# Patient Record
Sex: Male | Born: 1970 | Race: Black or African American | Hispanic: No | Marital: Single | Smoking: Current every day smoker
Health system: Southern US, Community
[De-identification: ages and names within clinical notes are randomized; demographics above are authoritative.]

## PROBLEM LIST (undated history)

## (undated) DIAGNOSIS — J439 Emphysema, unspecified: Secondary | ICD-10-CM

## (undated) DIAGNOSIS — F17200 Nicotine dependence, unspecified, uncomplicated: Secondary | ICD-10-CM

## (undated) HISTORY — DX: Emphysema, unspecified: J43.9

## (undated) HISTORY — DX: Nicotine dependence, unspecified, uncomplicated: F17.200

---

## 1998-11-15 ENCOUNTER — Emergency Department (HOSPITAL_COMMUNITY): Admission: EM | Admit: 1998-11-15 | Discharge: 1998-11-15 | Payer: Self-pay | Admitting: Emergency Medicine

## 1998-11-20 ENCOUNTER — Encounter: Payer: Self-pay | Admitting: Emergency Medicine

## 1998-11-20 ENCOUNTER — Emergency Department (HOSPITAL_COMMUNITY): Admission: EM | Admit: 1998-11-20 | Discharge: 1998-11-20 | Payer: Self-pay | Admitting: Emergency Medicine

## 1998-11-21 ENCOUNTER — Emergency Department (HOSPITAL_COMMUNITY): Admission: EM | Admit: 1998-11-21 | Discharge: 1998-11-21 | Payer: Self-pay | Admitting: Emergency Medicine

## 1998-11-21 ENCOUNTER — Encounter: Payer: Self-pay | Admitting: Emergency Medicine

## 1999-01-17 ENCOUNTER — Emergency Department (HOSPITAL_COMMUNITY): Admission: EM | Admit: 1999-01-17 | Discharge: 1999-01-17 | Payer: Self-pay | Admitting: Emergency Medicine

## 1999-10-22 ENCOUNTER — Emergency Department (HOSPITAL_COMMUNITY): Admission: EM | Admit: 1999-10-22 | Discharge: 1999-10-22 | Payer: Self-pay | Admitting: Emergency Medicine

## 1999-10-22 ENCOUNTER — Encounter: Payer: Self-pay | Admitting: Emergency Medicine

## 2000-01-16 ENCOUNTER — Encounter: Payer: Self-pay | Admitting: Emergency Medicine

## 2000-01-16 ENCOUNTER — Emergency Department (HOSPITAL_COMMUNITY): Admission: EM | Admit: 2000-01-16 | Discharge: 2000-01-16 | Payer: Self-pay | Admitting: Emergency Medicine

## 2002-12-08 ENCOUNTER — Emergency Department (HOSPITAL_COMMUNITY): Admission: EM | Admit: 2002-12-08 | Discharge: 2002-12-08 | Payer: Self-pay | Admitting: Emergency Medicine

## 2003-01-29 ENCOUNTER — Emergency Department (HOSPITAL_COMMUNITY): Admission: EM | Admit: 2003-01-29 | Discharge: 2003-01-29 | Payer: Self-pay | Admitting: Emergency Medicine

## 2003-10-22 ENCOUNTER — Emergency Department (HOSPITAL_COMMUNITY): Admission: AD | Admit: 2003-10-22 | Discharge: 2003-10-22 | Payer: Self-pay | Admitting: Family Medicine

## 2003-11-14 ENCOUNTER — Emergency Department (HOSPITAL_COMMUNITY): Admission: EM | Admit: 2003-11-14 | Discharge: 2003-11-14 | Payer: Self-pay | Admitting: *Deleted

## 2004-01-10 ENCOUNTER — Emergency Department (HOSPITAL_COMMUNITY): Admission: EM | Admit: 2004-01-10 | Discharge: 2004-01-10 | Payer: Self-pay | Admitting: Emergency Medicine

## 2004-03-19 ENCOUNTER — Emergency Department (HOSPITAL_COMMUNITY): Admission: EM | Admit: 2004-03-19 | Discharge: 2004-03-19 | Payer: Self-pay | Admitting: Emergency Medicine

## 2004-10-31 ENCOUNTER — Emergency Department (HOSPITAL_COMMUNITY): Admission: EM | Admit: 2004-10-31 | Discharge: 2004-11-01 | Payer: Self-pay | Admitting: Emergency Medicine

## 2009-05-14 ENCOUNTER — Emergency Department (HOSPITAL_COMMUNITY): Admission: EM | Admit: 2009-05-14 | Discharge: 2009-05-14 | Payer: Self-pay | Admitting: Family Medicine

## 2009-09-17 ENCOUNTER — Emergency Department (HOSPITAL_COMMUNITY): Admission: EM | Admit: 2009-09-17 | Discharge: 2009-09-17 | Payer: Self-pay | Admitting: Family Medicine

## 2010-12-26 ENCOUNTER — Emergency Department (HOSPITAL_COMMUNITY)
Admission: EM | Admit: 2010-12-26 | Discharge: 2010-12-26 | Disposition: A | Discharge status: Home / Self Care | Payer: Self-pay | Attending: Emergency Medicine | Admitting: Emergency Medicine

## 2010-12-26 DIAGNOSIS — M542 Cervicalgia: Secondary | ICD-10-CM | POA: Insufficient documentation

## 2010-12-26 DIAGNOSIS — R209 Unspecified disturbances of skin sensation: Secondary | ICD-10-CM | POA: Insufficient documentation

## 2010-12-26 DIAGNOSIS — M25519 Pain in unspecified shoulder: Secondary | ICD-10-CM | POA: Insufficient documentation

## 2011-07-21 ENCOUNTER — Inpatient Hospital Stay (INDEPENDENT_AMBULATORY_CARE_PROVIDER_SITE_OTHER)
Admission: RE | Admit: 2011-07-21 | Discharge: 2011-07-21 | Disposition: A | Discharge status: Home / Self Care | Payer: Managed Care, Other (non HMO) | Source: Ambulatory Visit | Attending: Family Medicine | Admitting: Family Medicine

## 2011-07-21 ENCOUNTER — Emergency Department (HOSPITAL_COMMUNITY): Payer: Managed Care, Other (non HMO)

## 2011-07-21 ENCOUNTER — Encounter (HOSPITAL_COMMUNITY): Payer: Self-pay | Admitting: Radiology

## 2011-07-21 ENCOUNTER — Inpatient Hospital Stay (HOSPITAL_COMMUNITY)
Admission: EM | Admit: 2011-07-21 | Discharge: 2011-07-24 | DRG: 190 | Disposition: A | Discharge status: Home / Self Care | Payer: Managed Care, Other (non HMO) | Source: Ambulatory Visit | Attending: Internal Medicine | Admitting: Internal Medicine

## 2011-07-21 ENCOUNTER — Ambulatory Visit (INDEPENDENT_AMBULATORY_CARE_PROVIDER_SITE_OTHER): Payer: Managed Care, Other (non HMO)

## 2011-07-21 DIAGNOSIS — J189 Pneumonia, unspecified organism: Secondary | ICD-10-CM

## 2011-07-21 DIAGNOSIS — R9389 Abnormal findings on diagnostic imaging of other specified body structures: Secondary | ICD-10-CM

## 2011-07-21 DIAGNOSIS — R634 Abnormal weight loss: Secondary | ICD-10-CM | POA: Diagnosis present

## 2011-07-21 DIAGNOSIS — F17200 Nicotine dependence, unspecified, uncomplicated: Secondary | ICD-10-CM | POA: Diagnosis present

## 2011-07-21 DIAGNOSIS — J4489 Other specified chronic obstructive pulmonary disease: Secondary | ICD-10-CM | POA: Diagnosis present

## 2011-07-21 DIAGNOSIS — J439 Emphysema, unspecified: Principal | ICD-10-CM | POA: Diagnosis present

## 2011-07-21 DIAGNOSIS — J449 Chronic obstructive pulmonary disease, unspecified: Secondary | ICD-10-CM | POA: Diagnosis present

## 2011-07-21 DIAGNOSIS — J438 Other emphysema: Secondary | ICD-10-CM

## 2011-07-21 LAB — CBC
Hemoglobin: 13.1 g/dL (ref 13.0–17.0)
MCH: 31.3 pg (ref 26.0–34.0)
Platelets: 130 10*3/uL — ABNORMAL LOW (ref 150–400)
RBC: 4.19 MIL/uL — ABNORMAL LOW (ref 4.22–5.81)
WBC: 9.4 10*3/uL (ref 4.0–10.5)

## 2011-07-21 LAB — CK TOTAL AND CKMB (NOT AT ARMC)
CK, MB: 1.6 ng/mL (ref 0.3–4.0)
Relative Index: 1 (ref 0.0–2.5)
Total CK: 162 U/L (ref 7–232)

## 2011-07-21 LAB — DIFFERENTIAL
Basophils Absolute: 0 10*3/uL (ref 0.0–0.1)
Basophils Relative: 0 % (ref 0–1)
Eosinophils Absolute: 0.1 10*3/uL (ref 0.0–0.7)
Monocytes Relative: 12 % (ref 3–12)
Neutro Abs: 6.8 10*3/uL (ref 1.7–7.7)
Neutrophils Relative %: 72 % (ref 43–77)

## 2011-07-21 LAB — TROPONIN I: Troponin I: <0.3 ng/mL (ref <0.30)

## 2011-07-21 LAB — BASIC METABOLIC PANEL
CO2: 31 mEq/L (ref 19–32)
Chloride: 101 mEq/L (ref 96–112)
Glucose, Bld: 99 mg/dL (ref 70–99)
Potassium: 4.5 mEq/L (ref 3.5–5.1)
Sodium: 139 mEq/L (ref 135–145)

## 2011-07-21 LAB — LIPID PANEL
Cholesterol: 172 mg/dL (ref 0–200)
HDL: 71 mg/dL (ref >39)
LDL Cholesterol: 82 mg/dL (ref 0–99)
Total CHOL/HDL Ratio: 2.4 RATIO
Triglycerides: 95 mg/dL (ref <150)
VLDL: 19 mg/dL (ref 0–40)

## 2011-07-21 LAB — PRO B NATRIURETIC PEPTIDE: Pro B Natriuretic peptide (BNP): 38.8 pg/mL (ref 0–125)

## 2011-07-21 MED ORDER — IOHEXOL 300 MG/ML  SOLN
90.0000 mL | Freq: Once | INTRAMUSCULAR | Status: AC | PRN
  Administered 2011-07-21: 90 mL via INTRAVENOUS

## 2011-07-22 DIAGNOSIS — J438 Other emphysema: Secondary | ICD-10-CM

## 2011-07-22 DIAGNOSIS — F17200 Nicotine dependence, unspecified, uncomplicated: Secondary | ICD-10-CM

## 2011-07-22 DIAGNOSIS — J189 Pneumonia, unspecified organism: Secondary | ICD-10-CM

## 2011-07-22 LAB — COMPREHENSIVE METABOLIC PANEL
ALT: 9 U/L (ref 0–53)
AST: 12 U/L (ref 0–37)
CO2: 30 mEq/L (ref 19–32)
Chloride: 103 mEq/L (ref 96–112)
GFR calc Af Amer: >60 mL/min (ref >60)
GFR calc non Af Amer: >60 mL/min (ref >60)
Glucose, Bld: 101 mg/dL — ABNORMAL HIGH (ref 70–99)
Sodium: 139 mEq/L (ref 135–145)
Total Bilirubin: 0.4 mg/dL (ref 0.3–1.2)

## 2011-07-22 LAB — TSH: TSH: 1.812 u[IU]/mL (ref 0.350–4.500)

## 2011-07-22 LAB — HEMOGLOBIN A1C
Hgb A1c MFr Bld: 5.5 % (ref <5.7)
Mean Plasma Glucose: 111 mg/dL (ref <117)

## 2011-07-22 LAB — CBC
HCT: 35.5 % — ABNORMAL LOW (ref 39.0–52.0)
Hemoglobin: 11.8 g/dL — ABNORMAL LOW (ref 13.0–17.0)
Hemoglobin: 11.9 g/dL — ABNORMAL LOW (ref 13.0–17.0)
MCH: 30.7 pg (ref 26.0–34.0)
MCH: 31.2 pg (ref 26.0–34.0)
MCHC: 33.2 g/dL (ref 30.0–36.0)
MCHC: 33.5 g/dL (ref 30.0–36.0)
MCV: 92.4 fL (ref 78.0–100.0)
MCV: 93.2 fL (ref 78.0–100.0)
Platelets: 146 10*3/uL — ABNORMAL LOW (ref 150–400)
RBC: 3.84 MIL/uL — ABNORMAL LOW (ref 4.22–5.81)
RDW: 12.9 % (ref 11.5–15.5)

## 2011-07-22 LAB — CARDIAC PANEL(CRET KIN+CKTOT+MB+TROPI)
CK, MB: 1.6 ng/mL (ref 0.3–4.0)
Relative Index: 1.3 (ref 0.0–2.5)
Total CK: 125 U/L (ref 7–232)
Troponin I: <0.3 ng/mL (ref <0.30)

## 2011-07-23 LAB — VITAMIN B12: Vitamin B-12: 370 pg/mL (ref 211–911)

## 2011-07-23 LAB — FOLATE: Folate: 5.8 ng/mL

## 2011-07-23 LAB — FERRITIN: Ferritin: 384 ng/mL — ABNORMAL HIGH (ref 22–322)

## 2011-07-28 LAB — CULTURE, BLOOD (ROUTINE X 2)
Culture  Setup Time: 201209110219
Culture  Setup Time: 201209110220
Culture: NO GROWTH
Culture: NO GROWTH

## 2011-08-11 NOTE — Discharge Summary (Signed)
  NAMELAMBERT, [name] NO.:  [id number]  MEDICAL RECORD NO.:  [id number]  LOCATION:                                 FACILITY:  PHYSICIAN:  Richarda Overlie, MD       DATE OF BIRTH:  August 21, 1971  DATE OF ADMISSION:  07/21/2011 DATE OF DISCHARGE:  07/24/2011                              DISCHARGE SUMMARY   PRIMARY CARE PHYSICIAN:  Dr. Florence Canner.  DISCHARGE DIAGNOSES: 1. Bullous emphysema with community-acquired pneumonia. 2. Nicotine dependence.  PERTINENT LABORATORY DATA:  AFB sputum smear is negative.  Blood culture negative x2.  HIV antibody ELISA is negative.  Alpha 1-antitrypsin was within normal limits.  Urine streptococcal antigen was negative.  RADIOLOGY REPORT:  CT angio of the chest showed no evidence of PE, but with the right upper lobe pneumonia superimposed upon changes of bullous emphysema.  Chest x-ray shows patchy parenchymal densities in the right upper lobe.  CONSULTATION:  PCCM for possible cavitary lesion and bullous emphysema.  SUBJECTIVE:  This is a 40 year old male who presents with a chief complaint of right shoulder pain and pleuritic chest pain.  In the ER, he was found to have a cavitary lesion initially, but a CT revealed that this was just a bullae secondary to his bullous emphysema with superimposed community-acquired pneumonia.  Because of the patient's 60- pound weight loss, he was evaluated by Pulmonary Critical Care to rule out the possibility of an underlying malignancy.  The patient was initially started on Zosyn IV subsequently switched to Avelox.  The patient had a PPD placed which was negative.  The patient was worked up for his bullous emphysema and was ruled out for alpha 1-antitrypsin deficiency.  His AFB culture was negative and, therefore, the patient's negative pressure isolation is being discontinued.  The PPD was placed which was also negative.  The patient would closely follow up with Dr. Vassie Loll.  He would need a  followup chest x-ray in 2-3 weeks and possibly a bronchoscopy if there is no resolution.  The patient will be discharged home on Avelox for about 10 days.  DISCHARGE MEDICATIONS: 1. Albuterol 2 puffs inhaled q.4 p.r.n. 2. Mucinex 600 mg p.o. twice daily for 10 days. 3. Robitussin DM 5 mL p.o. q.4 p.r.n. 4. Norco 5/325 one to two tablets p.o. q.4 p.r.n. 5. Avelox 400 mg p.o. daily for 10 days. 6. Multivitamin 1 tablet p.o. daily. 7. Vitamin B12 2500 mcg 1 tablet p.o. daily.     Richarda Overlie, MD     NA/MEDQ  D:  07/24/2011  T:  07/24/2011  Job:  [id number]  Electronically Signed by Richarda Overlie MD on 08/11/2011 02:48:12 PM

## 2011-08-11 NOTE — H&P (Signed)
[name], [name] NO.:  [id number]  MEDICAL RECORD NO.:  [id number]  LOCATION:  URG                          FACILITY:  [facility]  PHYSICIAN:  Richarda Overlie, MD       DATE OF BIRTH:  04-Apr-1971  DATE OF ADMISSION:  07/21/2011 DATE OF DISCHARGE:                             HISTORY & PHYSICAL   CHIEF COMPLAINT:  Chest pain.  SUBJECTIVE:  This is a 40 year old male who presents to the ER with a chief complaint of right-sided chest discomfort.  The patient describes the pain as sharp, stabbing pain located in the right side of his chest, radiating into the right shoulder and the right upper back.  This morning, the patient felt tired, drowsy, and had difficulty getting out of bed.  The pain was so severe that he treated himself with some Goody powder.  The patient also had some shortness of breath today, and he started developing chills 2-3 days ago.  The patient denies any sick contacts.  Denies any recent history of travel.  He states that he has smoked for more than 25 years and has cut back to 5 cigarettes a day. He has also lost about 60 pounds in the last 1-1/2 years.  He denies any orthopnea, paroxysmal nocturnal dyspnea, dependent edema.  He denies any hemoptysis, hematemesis, melena, or hematochezia.  The patient does not specify any specific occupational exposures.  He denies any urinary urgency, frequency, or dysuria.  He is found to have a low-grade fever of 100 degrees Fahrenheit in the ER.  PAST MEDICAL HISTORY:  None.  PAST SURGICAL HISTORY:  None.  FAMILY HISTORY:  Positive for hypertension, diabetes in the mother.  The father died of a heart attack at the age of 19 and also was diagnosed with prostate cancer.  SOCIAL HISTORY:  The patient has smoked for over 25 years, now down to 5 cigarettes per day.  Occasional alcohol use.  He works in a [employer].  ALLERGIES:  No known drug allergies.  HOME MEDICATIONS:  Goody  powder.  PHYSICAL EXAMINATION:  VITAL SIGNS: Blood pressure 126/81, pulse of 92, respirations 16, temperature 100. GENERAL:  Comfortable, in no acute cardiopulmonary distress. HEENT:  Pupils equal and reactive.  Extraocular movements intact. NECK:  Supple.  No JVD. LUNGS:  Clear to auscultation bilaterally.  No wheezes, crackles, or rhonchi. CARDIOVASCULAR:  Regular rate and rhythm.  No murmurs, rubs, or gallops. ABDOMEN:  Soft, nontender, nondistended. EXTREMITIES:  Without cyanosis, clubbing, or edema. NEUROLOGIC:  Cranial nerves II through XII grossly intact. PSYCHIATRIC:  Appropriate mood and affect. LYMPHATIC:  No axillary, inguinal, cervical lymphadenopathy.  LABORATORY DATA:  WBC 9.4, hemoglobin 13.1, hematocrit 38.6, and a platelet count of 130.  BMET:  Sodium 139, potassium 4.5, chloride 101, bicarb 31, glucose 99, BUN 12, creatinine 1.18, calcium 9.3.  CT angio of the chest, no evidence of pulmonary embolism.  The patient has emphysematous changes in both lungs with blebs involving both the lung apices, right greater than left and an airspace consolidation involving the posterior right upper lobe in an area of bullous changes. There are no obvious lesions.  The  patient does have a right hilar lymph node which is 2.1 x 1.2 cm.  The patient also has a slight elevation of the left hemidiaphragm.  There is no evidence of pulmonary embolism.  ASSESSMENT: 1. Bullous emphysema with what looks like a community-acquired     pneumonia. 2. Rule out malignancy given significant weight loss and the patient     may need a bronchoscopy and pulmonary consultation.  PLAN:  The patient will be admitted in a negative pressure room.  We will start him on Zosyn to cover him for community-acquired pneumonia as well as anaerobes.  The patient will have a PPD placed.  We will obtain a sputum culture and AFB culture stain.  We will also check him for HIV. Because of the chest pain and  significant family history, we will place him on telemetry, cycle cardiac enzymes to make sure that this is not cardiac in origin.  We will obtain blood cultures x2.  The patient is not actively wheezing at this time, so I do not feel that he needs any steroids, but he will be started on scheduled nebulizer treatments as well as Mucinex and Robitussin.  He is a full code.  Pulmonology consultation has been obtained and Dr. Tyson Alias has been notified.  I appreciate his input.     Richarda Overlie, MD     NA/MEDQ  D:  07/21/2011  T:  07/21/2011  Job:  [id number]  Electronically Signed by Richarda Overlie MD on 08/11/2011 02:48:39 PM

## 2011-08-21 ENCOUNTER — Ambulatory Visit (INDEPENDENT_AMBULATORY_CARE_PROVIDER_SITE_OTHER)
Admission: RE | Admit: 2011-08-21 | Discharge: 2011-08-21 | Disposition: A | Discharge status: Home / Self Care | Payer: Managed Care, Other (non HMO) | Source: Ambulatory Visit | Attending: Pulmonary Disease | Admitting: Pulmonary Disease

## 2011-08-21 ENCOUNTER — Ambulatory Visit (INDEPENDENT_AMBULATORY_CARE_PROVIDER_SITE_OTHER): Payer: Managed Care, Other (non HMO) | Admitting: Pulmonary Disease

## 2011-08-21 ENCOUNTER — Encounter: Payer: Self-pay | Admitting: Pulmonary Disease

## 2011-08-21 VITALS — BP 122/72 | HR 96 | Temp 98.7°F | Ht 74.0 in | Wt 190.0 lb

## 2011-08-21 DIAGNOSIS — J189 Pneumonia, unspecified organism: Secondary | ICD-10-CM

## 2011-08-21 DIAGNOSIS — F17200 Nicotine dependence, unspecified, uncomplicated: Secondary | ICD-10-CM

## 2011-08-21 DIAGNOSIS — Z720 Tobacco use: Secondary | ICD-10-CM

## 2011-08-21 NOTE — Patient Instructions (Signed)
Pneumonia has improved You must QUIT smoking

## 2011-08-21 NOTE — Progress Notes (Signed)
  Subjective:    Patient ID: [name], male    DOB: Sep 16, 1971, 40 y.o.   MRN: [id number]  HPI  40 year old male adm 9/10-13/12 for  right shoulder pain and pleuritic chest pain & 40 lb wt loss. CT angio of the chest showed no evidence of PE, but  with the right upper lobe pneumonia superimposed upon changes of bullous  emphysema.  Chest x-ray shows patchy parenchymal densities in the right  upper lobe.   He was started on Zosyn IV subsequently switched to Avelox. PPD  was negative.  HIv neg,  alpha 1-antitrypsin neg.  Sputum AFB culture was negative,  discharged home on Avelox for about 10 days.   08/21/2011 No fevers, gained a few pounds. No fevers,sweats,  cough has stopped since he stopped mucinex Spirometry >> nml lung function CXR >> clearing of apical infx    Review of Systems  Constitutional: Negative for fever, appetite change and unexpected weight change.  HENT: Negative for ear pain, congestion, sore throat, rhinorrhea, sneezing, trouble swallowing, dental problem and postnasal drip.   Eyes: Negative for redness.  Respiratory: Negative for cough, shortness of breath and wheezing.   Cardiovascular: Negative for chest pain, palpitations and leg swelling.  Gastrointestinal: Negative for nausea, vomiting, abdominal pain and diarrhea.  Genitourinary: Positive for urgency. Negative for dysuria.  Musculoskeletal: Negative for joint swelling.  Skin: Negative for rash.  Neurological: Negative for syncope and headaches.  Hematological: Does not bruise/bleed easily.  Psychiatric/Behavioral: Negative for dysphoric mood. The patient is not nervous/anxious.        Objective:   Physical Exam  Gen. Pleasant, well-nourished, in no distress, normal affect ENT - no lesions, no post nasal drip Neck: No JVD, no thyromegaly, no carotid bruits Lungs: no use of accessory muscles, no dullness to percussion, clear without rales or rhonchi  Cardiovascular: Rhythm regular, heart sounds  normal,  no murmurs or gallops, no peripheral edema Abdomen: soft and non-tender, no hepatosplenomegaly, BS normal. Musculoskeletal: No deformities, no cyanosis or clubbing Neuro:  alert, non focal       Assessment & Plan:

## 2011-08-21 NOTE — Assessment & Plan Note (Signed)
Community acquired, rt apical infiltrate resolved on CXR Rt hilar ln was probably reactive His wt loss is puzzling but doubt pulmonary cause Does not have COPD , lung function OK, some apical blebs + on CT.

## 2011-08-21 NOTE — Assessment & Plan Note (Signed)
Counselled, nicotine replacement discussed He will make a quit attempt

## 2011-09-08 LAB — AFB CULTURE WITH SMEAR (NOT AT ARMC): Acid Fast Smear: NONE SEEN

## 2011-12-15 ENCOUNTER — Ambulatory Visit (INDEPENDENT_AMBULATORY_CARE_PROVIDER_SITE_OTHER): Payer: BC Managed Care – PPO | Admitting: Physician Assistant

## 2011-12-15 VITALS — BP 96/57 | HR 80 | Temp 98.1°F | Resp 16 | Ht 74.0 in | Wt 195.0 lb

## 2011-12-15 DIAGNOSIS — M545 Low back pain, unspecified: Secondary | ICD-10-CM

## 2011-12-15 MED ORDER — MELOXICAM 15 MG PO TABS: 15.0000 mg | ORAL_TABLET | Freq: Every day | ORAL | Status: DC

## 2011-12-15 MED ORDER — CYCLOBENZAPRINE HCL 10 MG PO TABS: ORAL_TABLET | ORAL | Status: DC

## 2011-12-15 NOTE — Progress Notes (Signed)
  Subjective:    Patient ID: BOYDE GRIECO, male    DOB: [birth year], 41 y.o.   MRN: [id number]  HPI The patient presents with several days of lower back discomfort. It began on Friday, 12/12/2011 as "a little nagging." It is been worsening since then and is described as a sharp pressing pain. No known trauma or injury. No history of previous back injury or pain. Over-the-counter ibuprofen and a topical muscle rub helped some last night. No aggravating factors.  No loss of bowel or bladder control, no paresthesias, no radicular pain. No saddle anesthesia. The lower charity weakness.   Review of Systems As above, otherwise negative.    Objective:   Physical Exam  Vitals reviewed. Constitutional: He is oriented to person, place, and time. Vital signs are normal. He appears well-developed and well-nourished. No distress.  HENT:  Head: Normocephalic and atraumatic.  Neck: Normal range of motion. Neck supple.  Cardiovascular: Normal rate, regular rhythm and normal heart sounds.   Pulmonary/Chest: Effort normal and breath sounds normal.  Musculoskeletal: Normal range of motion. He exhibits tenderness. He exhibits no edema.       Lumbar back: He exhibits tenderness, pain and spasm. He exhibits normal range of motion, no bony tenderness, no swelling, no edema, no deformity and no laceration.  Neurological: He is alert and oriented to person, place, and time. He has normal strength and normal reflexes. No cranial nerve deficit or sensory deficit.  Skin: Skin is warm, dry and intact. No lesion and no rash noted.  Psychiatric: He has a normal mood and affect.          Assessment & Plan:   1. Lumbar back pain   discontinue over-the-counter ibuprofen. Replace with meloxicam. Cyclobenzaprine as needed. Rest. Warm compresses.

## 2011-12-15 NOTE — Patient Instructions (Signed)
Do not use Ibuprofen or Aleve while taking Meloxicam.  Apply a warm compress to the area 2-3 times daily.  Use proper body mechanics to lift/move even light objects.  Keep up the good work on quitting smoking.

## 2012-02-11 ENCOUNTER — Ambulatory Visit (INDEPENDENT_AMBULATORY_CARE_PROVIDER_SITE_OTHER): Payer: BC Managed Care – PPO | Admitting: Family Medicine

## 2012-02-11 ENCOUNTER — Ambulatory Visit: Payer: BC Managed Care – PPO

## 2012-02-11 VITALS — BP 110/78 | HR 76 | Temp 98.3°F | Resp 18 | Ht 75.0 in | Wt 193.8 lb

## 2012-02-11 DIAGNOSIS — M25561 Pain in right knee: Secondary | ICD-10-CM

## 2012-02-11 DIAGNOSIS — M25569 Pain in unspecified knee: Secondary | ICD-10-CM

## 2012-02-11 DIAGNOSIS — M2390 Unspecified internal derangement of unspecified knee: Secondary | ICD-10-CM

## 2012-02-11 MED ORDER — MELOXICAM 7.5 MG PO TABS: 7.5000 mg | ORAL_TABLET | Freq: Every day | ORAL | Status: DC

## 2012-02-11 NOTE — Patient Instructions (Signed)
Knee, Cartilage (Meniscus) Injury  It is suspected that you have a torn cartilage (meniscus) in your knee. The menisci are made of tough cartilage, and fit between the surfaces of the thigh and leg bones. The menisci are "C"shaped and have a wedged profile. The wedged profile helps the stability of the joint by keeping the rounded femur surface from sliding off the flat tibial surface. The menisci are fed (nourished) by small blood vessels; but there is also a large area at the inner edge of the meniscus that does not have a good blood supply (avascular). This presents a problem when there is an injury to the meniscus, because areas without good blood supply heal poorly. As a result when there is a torn cartilage in the knee, surgery is often required to fix it. This is usually done with a surgical procedure less invasive than open surgery (arthroscopy). Some times open surgery of the knee is required if there is other damage.  PURPOSE OF THE MENISCUS  The medial meniscus rests on the medial tibial plateau. The tibia is the large bone in your lower leg (the shin bone). The medial tibial plateau is the upper end of the bone making up the inner part of your knee. The lateral meniscus serves the same purpose and is located on the outside of the knee. The menisci help to distribute your body weight across the knee joint; they act as shock absorbers. Without the meniscus present, the weight of your body would be unevenly applied to the bones in your legs (the femur and tibia). The femur is the large bone in your thigh. This uneven weight distribution would cause increased wear and tear on the cartilage lining the joint surfaces, leading to early damage (arthritis) of these areas. The presence of the menisci cartilage is necessary for a healthy knee.  PURPOSE OF THE KNEE CARTILAGE  The knee joint is made up of three bones: the thigh bone (femur), the shin bone (tibia), and the knee cap (patella). The surfaces of these  bones at the knee joint are covered with cartilage called articular cartilage. This smooth slippery surface allows the bones to slide against each other without causing bone damage. The meniscus sits between these cartilaginous surfaces of the bones. It distributes the weight evenly in the joints and helps with the stability of the joint (keeps the joint steady).  HOME CARE INSTRUCTIONS   Use crutches and external braces as instructed.   Once home an ice pack applied to your injured knee may help with discomfort and keep the swelling down. An ice pack can be used for the first couple of days or as instructed.   Only take over-the-counter or prescription medicines for pain, discomfort, or fever as directed by your caregiver.   Call if you do not have relief of pain with medications or if there is increasing in pain.   Call if your foot becomes cold or blue.   You may resume normal diet and activities as directed.   Make sure to keep your appointment with your follow-up caregiver. This injury may require further evaluation and treatment beyond the temporary treatment given today.  Document Released: 01/17/2003 Document Revised: 10/16/2011 Document Reviewed: 05/11/2009  [employer] Patient Information 2012 [employer]

## 2012-02-11 NOTE — Progress Notes (Signed)
Knee immobilizer fit and trained for right knee

## 2012-02-11 NOTE — Progress Notes (Signed)
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  It has been sore ever since with some swelling above the knee joint line.  No prior hx of similar episodes.    O:  NAD  Right Knee insp:  Normal Palpation:  Some popliteal fullness ROM limited to 90 degrees because of pain, crepitus palpable Pain with grinding maneuver as patient lies on stomach.  [facility] reading (PRIMARY) by  Dr. Milus Glazier  Right knee.no acute findings  A: internal derangement of right knee  P:  Knee immobilizer meloxicam referral

## 2012-02-12 ENCOUNTER — Telehealth: Payer: Self-pay

## 2012-02-12 ENCOUNTER — Encounter: Payer: Self-pay | Admitting: Family Medicine

## 2012-02-12 NOTE — Telephone Encounter (Signed)
Note written and called patient to notify, he states that he was already notified this and has picked up OOW note.Marland KitchenMarland Kitchen

## 2012-02-12 NOTE — Telephone Encounter (Signed)
Pt states that he company would not accept the oow note that you gave him because you wrote on the bottom.  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

## 2012-02-12 NOTE — Telephone Encounter (Signed)
Pt also wants to know if he could have the oow note written for him to be excused from 4/2-4/4 and he can return to work on 4/5

## 2012-02-12 NOTE — Telephone Encounter (Signed)
Yes, he may have that note

## 2012-04-15 ENCOUNTER — Other Ambulatory Visit: Payer: Self-pay | Admitting: Internal Medicine

## 2012-04-15 DIAGNOSIS — R799 Abnormal finding of blood chemistry, unspecified: Secondary | ICD-10-CM

## 2012-04-20 ENCOUNTER — Ambulatory Visit
Admission: RE | Admit: 2012-04-20 | Discharge: 2012-04-20 | Disposition: A | Discharge status: Home / Self Care | Payer: BC Managed Care – PPO | Source: Ambulatory Visit | Attending: Internal Medicine | Admitting: Internal Medicine

## 2012-04-20 DIAGNOSIS — R799 Abnormal finding of blood chemistry, unspecified: Secondary | ICD-10-CM

## 2012-05-31 ENCOUNTER — Emergency Department (HOSPITAL_COMMUNITY)
Admission: EM | Admit: 2012-05-31 | Discharge: 2012-05-31 | Disposition: A | Discharge status: Home / Self Care | Payer: BC Managed Care – PPO | Attending: Emergency Medicine | Admitting: Emergency Medicine

## 2012-05-31 ENCOUNTER — Encounter (HOSPITAL_COMMUNITY): Payer: Self-pay | Admitting: *Deleted

## 2012-05-31 DIAGNOSIS — M545 Low back pain, unspecified: Secondary | ICD-10-CM | POA: Insufficient documentation

## 2012-05-31 DIAGNOSIS — M5430 Sciatica, unspecified side: Secondary | ICD-10-CM

## 2012-05-31 DIAGNOSIS — F17200 Nicotine dependence, unspecified, uncomplicated: Secondary | ICD-10-CM | POA: Insufficient documentation

## 2012-05-31 DIAGNOSIS — M549 Dorsalgia, unspecified: Secondary | ICD-10-CM

## 2012-05-31 DIAGNOSIS — R209 Unspecified disturbances of skin sensation: Secondary | ICD-10-CM | POA: Insufficient documentation

## 2012-05-31 MED ORDER — KETOROLAC TROMETHAMINE 60 MG/2ML IM SOLN
60.0000 mg | Freq: Once | INTRAMUSCULAR | Status: AC
  Administered 2012-05-31: 60 mg via INTRAMUSCULAR
  Filled 2012-05-31: qty 2

## 2012-05-31 MED ORDER — TRAMADOL HCL 50 MG PO TABS: 50.0000 mg | ORAL_TABLET | Freq: Four times a day (QID) | ORAL | Status: DC | PRN

## 2012-05-31 MED ORDER — DICLOFENAC SODIUM 75 MG PO TBEC: 75.0000 mg | DELAYED_RELEASE_TABLET | Freq: Two times a day (BID) | ORAL | Status: AC

## 2012-05-31 MED ORDER — CYCLOBENZAPRINE HCL 10 MG PO TABS: 10.0000 mg | ORAL_TABLET | Freq: Two times a day (BID) | ORAL | Status: AC | PRN

## 2012-05-31 MED ORDER — DEXAMETHASONE SODIUM PHOSPHATE 10 MG/ML IJ SOLN
10.0000 mg | Freq: Once | INTRAMUSCULAR | Status: AC
  Administered 2012-05-31: 10 mg via INTRAMUSCULAR
  Filled 2012-05-31: qty 1

## 2012-05-31 MED ORDER — METHOCARBAMOL 500 MG PO TABS
500.0000 mg | ORAL_TABLET | Freq: Once | ORAL | Status: AC
  Administered 2012-05-31: 500 mg via ORAL
  Filled 2012-05-31: qty 1

## 2012-05-31 NOTE — ED Provider Notes (Signed)
Medical screening examination/treatment/procedure(s) were performed by non-physician practitioner and as supervising physician I was immediately available for consultation/collaboration. Devoria Albe, MD, FACEP   Ward Givens, MD 05/31/12 1101

## 2012-05-31 NOTE — ED Notes (Signed)
QIO:NG29<BM> Expected date:05/31/12<BR> Expected time: 9:02 AM<BR> Means of arrival:Ambulance<BR> Comments:<BR> 46yoM, L leg pain,hx of DVT

## 2012-05-31 NOTE — ED Notes (Signed)
Patient was going to sit outside on porch when he noticed sudden and unexplained onset of pain shooting to right hip and lower back. The pain was worse upon waking up this morning. Currently rating pain at a 8/10

## 2012-05-31 NOTE — ED Provider Notes (Signed)
History     CSN: [id number]  Arrival date & time 05/31/12  7829   First MD Initiated Contact with Patient 05/31/12 0915      9:41 AM HPI Pt reports yesterdady he developed lower back pain the radiates to his RLE. Describes the pain as sharp and shooting. Reports certain positions worsen pain. Denies saddle anesthesias, perineal numbness, weakness, traumatic injury. Urinary symptoms, abdominal pain, n/v/d. Reports at times feels like his leg is going to give out on him.   Patient is a 41 y.o. male presenting with back pain. The history is provided by the patient.  Back Pain  This is a new problem. The current episode started yesterday. The problem occurs constantly. The problem has been gradually worsening. The pain is associated with lifting heavy objects. The pain is present in the lumbar spine. The quality of the pain is described as shooting and stabbing. The pain radiates to the right thigh. The pain is severe. The symptoms are aggravated by certain positions. Associated symptoms include numbness. Pertinent negatives include no chest pain, no fever, no headaches, no abdominal pain, no dysuria and no weakness. He has tried nothing for the symptoms.    Past Medical History  Diagnosis Date  . Bullous emphysema     WITH COMMUNITY ACQUIRED PNEUMONIA  . Nicotine dependence     No past surgical history on file.  Family History  Problem Relation Age of Onset  . Prostate cancer Father   . Heart attack Father   . Cancer    . Hypertension Mother   . Diabetes Mother     History  Substance Use Topics  . Smoking status: Current Everyday Smoker -- 0.5 packs/day for 25 years    Types: Cigarettes  . Smokeless tobacco: Never Used   Comment: currently down to 3 cig daily  . Alcohol Use: No     Beer       Review of Systems  Constitutional: Negative for fever and chills.  HENT: Negative for neck pain.   Respiratory: Negative for cough and shortness of breath.   Cardiovascular:  Negative for chest pain and palpitations.  Gastrointestinal: Negative for nausea, vomiting and abdominal pain.  Genitourinary: Negative for dysuria, hematuria and flank pain.  Musculoskeletal: Positive for back pain. Negative for myalgias and gait problem.       Denies saddle anesthesias, perineal numbness, bowel incontinence, urinary incontinence  Neurological: Positive for numbness. Negative for dizziness, weakness and headaches.  All other systems reviewed and are negative.    Allergies  Review of patient's allergies indicates no known allergies.  Home Medications   Current Outpatient Rx  Name Route Sig Dispense Refill  . ACETAMINOPHEN 500 MG PO TABS Oral Take 1,000 mg by mouth every 6 (six) hours as needed. Pain    . VITAMIN B-12 2500 MCG SL SUBL Sublingual Place 1 tablet under the tongue daily.      . MULTIVITAMIN & MINERAL PO Oral Take 1 tablet by mouth daily.      . SAW PALMETTO PO Oral Take 1 tablet by mouth 2 (two) times daily.        BP 115/71  Pulse 68  Temp 98.3 F (36.8 C) (Oral)  Resp 16  SpO2 100%  Physical Exam  Constitutional: He is oriented to person, place, and time. He appears well-developed and well-nourished.  HENT:  Head: Normocephalic and atraumatic.  Eyes: Conjunctivae are normal. Pupils are equal, round, and reactive to light.  Neck: Normal range of motion.  Neck supple.  Cardiovascular: Normal rate, regular rhythm and normal heart sounds.   Pulmonary/Chest: Effort normal and breath sounds normal.  Abdominal: Soft. Bowel sounds are normal.  Musculoskeletal:       Lumbar back: He exhibits tenderness, pain and spasm. He exhibits normal range of motion, no swelling, no edema, no deformity and normal pulse.       Back:       Full ROm of L-spine. Tenderness reproducible with percussion of lumbar paraspinal region. Positive SLR. Normal distal pulses and sensation of lower extremities. No skin abnormalities  Neurological: He is alert and oriented to  person, place, and time.  Skin: Skin is warm and dry. No rash noted. No erythema. No pallor.  Psychiatric: He has a normal mood and affect. His behavior is normal.    ED Course  Procedures  MDM   Treat with anti-inflammatory medication and muscle relaxants. Will provide followup with orthopedic physician if no improvement of symptoms in 2-3 weeks. Likely patient has a muscle strain versus sciatica. No Traumatic injury noted; no signs of cauda equina. Pt voices understanding and is ready for d/c      Thomasene Lot, PA-C 05/31/12 8295

## 2012-07-13 ENCOUNTER — Emergency Department (INDEPENDENT_AMBULATORY_CARE_PROVIDER_SITE_OTHER)
Admission: EM | Admit: 2012-07-13 | Discharge: 2012-07-13 | Disposition: A | Discharge status: Home / Self Care | Payer: BC Managed Care – PPO | Source: Home / Self Care

## 2012-07-13 ENCOUNTER — Emergency Department (INDEPENDENT_AMBULATORY_CARE_PROVIDER_SITE_OTHER): Payer: BC Managed Care – PPO

## 2012-07-13 ENCOUNTER — Encounter (HOSPITAL_COMMUNITY): Payer: Self-pay | Admitting: *Deleted

## 2012-07-13 DIAGNOSIS — L03019 Cellulitis of unspecified finger: Secondary | ICD-10-CM

## 2012-07-13 DIAGNOSIS — L02519 Cutaneous abscess of unspecified hand: Secondary | ICD-10-CM

## 2012-07-13 MED ORDER — LIDOCAINE HCL (PF) 1 % IJ SOLN
INTRAMUSCULAR | Status: AC
  Filled 2012-07-13: qty 5

## 2012-07-13 NOTE — ED Provider Notes (Signed)
History     CSN: [id number]  Arrival date & time 07/13/12  1821   None     Chief Complaint  Patient presents with  . Hand Injury    (Consider location/radiation/quality/duration/timing/severity/associated sxs/prior treatment) HPI Comments: Struck L 5th digit on a machine object at work 5 d ago. Did not notice any particular problem associated with that occurrence for a couple of days. About 3 d ago developed mild swelling to the proximal phalynx that has progressed through today. Able to flex the digit partially, is not locked into extension. Distal sensation is intact.    Past Medical History  Diagnosis Date  . Bullous emphysema     WITH COMMUNITY ACQUIRED PNEUMONIA  . Nicotine dependence     History reviewed. No pertinent past surgical history.  Family History  Problem Relation Age of Onset  . Prostate cancer Father   . Heart attack Father   . Cancer    . Hypertension Mother   . Diabetes Mother     History  Substance Use Topics  . Smoking status: Current Everyday Smoker -- 1.0 packs/day for 25 years    Types: Cigarettes  . Smokeless tobacco: Never Used   Comment: currently down to 3 cig daily  . Alcohol Use: Yes     Beer - socially      Review of Systems  Constitutional: Negative.   Respiratory: Negative.   Musculoskeletal:       See HPI  Neurological: Negative.   Hematological: Negative.   Psychiatric/Behavioral: Negative.     Allergies  Review of patient's allergies indicates no known allergies.  Home Medications   Current Outpatient Rx  Name Route Sig Dispense Refill  . ACETAMINOPHEN 500 MG PO TABS Oral Take 1,000 mg by mouth every 6 (six) hours as needed. Pain    . VITAMIN B-12 2500 MCG SL SUBL Sublingual Place 1 tablet under the tongue daily.      Marland Kitchen DICLOFENAC SODIUM 75 MG PO TBEC Oral Take 1 tablet (75 mg total) by mouth 2 (two) times daily. 30 tablet 0  . MULTIVITAMIN & MINERAL PO Oral Take 1 tablet by mouth daily.      . SAW PALMETTO PO  Oral Take 1 tablet by mouth 2 (two) times daily.        BP 117/83  Pulse 78  Temp 98.5 F (36.9 C) (Oral)  Resp 16  SpO2 98%  Physical Exam  Constitutional: He is oriented to person, place, and time. He appears well-developed and well-nourished.  Neck: Normal range of motion. Neck supple.  Musculoskeletal: He exhibits edema and tenderness.       See HPI for finger exam  Neurological: He is alert and oriented to person, place, and time.  Skin: Skin is warm and dry.  Psychiatric: He has a normal mood and affect.    ED Course  Procedures (including critical care time)   Labs Reviewed  CULTURE, ROUTINE-ABSCESS   Dg Hand Complete Left  07/13/2012  *RADIOLOGY REPORT*  Clinical Data: Post puncture injury involving the fifth PIP joint.  LEFT HAND - COMPLETE 3+ VIEW  Comparison: None.  Findings:  There is minimal soft tissue swelling about the radial aspect of the proximal phalanx of the fifth digit.  This finding is without associated fracture or dislocation.  Punctate (approximately 1 mm) radiopaque foreign body within the soft tissues about the ulnar aspect of the proximal phalanx of the fifth digit.   No definite osteolysis to suggest osteomyelitis.  Joint  spaces are preserved. No erosions.  IMPRESSION: 1.  Minimal soft tissue swelling about the radial aspect of the proximal phalanx of the fifth digit without associated fracture. No definite evidence of osteomyelitis.  2. Punctate (1 mm) radiopaque foreign body is seen within the soft tissues about the ulnar aspect of the proximal phalanx of the fifth digit.   Original Report Authenticated By: Waynard Reeds, M.D.      1. Cellulitis and abscess of finger       MDM  Has appt to see Dr. Amanda Pea tomorrow for follow up.  Keep elevated the hand Rx per Dr. Duwayne Heck as directed and doxyclycline Dg Hand Complete Left  07/13/2012  *RADIOLOGY REPORT*  Clinical Data: Post puncture injury involving the fifth PIP joint.  LEFT HAND -  COMPLETE 3+ VIEW  Comparison: None.  Findings:  There is minimal soft tissue swelling about the radial aspect of the proximal phalanx of the fifth digit.  This finding is without associated fracture or dislocation.  Punctate (approximately 1 mm) radiopaque foreign body within the soft tissues about the ulnar aspect of the proximal phalanx of the fifth digit.   No definite osteolysis to suggest osteomyelitis.  Joint spaces are preserved. No erosions.  IMPRESSION: 1.  Minimal soft tissue swelling about the radial aspect of the proximal phalanx of the fifth digit without associated fracture. No definite evidence of osteomyelitis.  2. Punctate (1 mm) radiopaque foreign body is seen within the soft tissues about the ulnar aspect of the proximal phalanx of the fifth digit.   Original Report Authenticated By: Waynard Reeds, M.D.    Dr. Amanda Pea was called prior to initiating a plan to release the fluctuant lesion to discuss treatment. He was kind enough to come to the urgent care and for eval and treatment that included deep I and D, debridement, irrigation and immobilization.          Hayden Rasmussen, NP 07/13/12 2157

## 2012-07-13 NOTE — ED Notes (Signed)
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 Woke up this morning with left 5th finger and hand swollen, red and hot.

## 2012-07-14 NOTE — Consult Note (Signed)
NAMEDEKLYN, GIBBON NO.:  [id number]  MEDICAL RECORD NO.:  [id number]  LOCATION:  [facility]                         FACILITY:  [facility]  PHYSICIAN:  Dionne Ano. Jaedin Regina, M.D.DATE OF BIRTH:  08-28-1971  DATE OF CONSULTATION: DATE OF DISCHARGE:  07/13/2012                                CONSULTATION   [name] is a 41 year old male who presented to the emergency room at [facility] for evaluation and treatment of his upper extremity predicament.  Five days ago, he was injured at work when a hog ring hit his left small finger on volar aspect of the proximal phalanx. He has had progressive swelling, and this has accumulated into pain, redness, and a visit to the emergency room.  I was asked to see in consult on his care by emergency room staff.  He is here today with his wife.  He complains of mild pain, intermittent in nature on examination.  PAST MEDICAL HISTORY:  None significant.  PAST SURGICAL HISTORY:  None.  MEDICINES:  None.  ALLERGIES:  None although HYDROCODONE does not sit with him well and causes nausea.  SOCIAL HISTORY:  He does not drink or use illicit drugs.  PHYSICAL EXAMINATION:  GENERAL:  A pleasant black male, alert and oriented, in no distress. VITAL SIGNS:  Stable. MUSCULOSKELETAL:  The patient has full range of motion about the elbow, wrist, and forearm.  He has a small finger with an area of entrance secondary to trauma and pain.  He has no evidence of infection or dystrophy in the index, middle, and ring, but certainly has a focus of infection about the small finger.  There is no evidence of instability. There is no evidence of dystrophic reaction.  He has callous formation about the hands and the most impressive issue is the fullness and fluctuance over the volar aspect of P1 proximal phalanx region volarly. There are no frank Kanavel signs.  Opposite extremity is neurovascularly intact. CHEST:  Clear. HEENT:  Within normal  limits. EXTREMITIES:  Lower extremity examination is benign. ABDOMEN:  Nontender.  I have reviewed these issues at length and the findings.  At the present time, we reviewed his issues and x-rays.  His x-rays do not show any obvious fracture-dislocation, space-occupying lesion in the bone.  There is a small area of concern about the volar soft tissues with a small fleck.  IMPRESSION:  Abscess, left small finger.  PLAN:  He was taken to procedure suite, underwent an intermetacarpal block followed by a thorough prep and drape, Betadine scrub and paint. Following this, I performed I and D of skin, subcutaneous tissue.  This was irrigation and debridement of a deep abscess.  It did not involve the flexor sheath.  I debrided this aggressively, packed it after copious irrigation, and dressed him sterilely.  DISPOSITION:  He will be discharged on doxycycline 100 mg 1 p.o. b.i.d., Nucynta for pain 50 mg 1-2 q.4 h. p.r.n. pain p.o.  See me in the office tomorrow.  Notify me should any problems occur.  We will watch his condition closely.  It has been a pleasure to participate in his care.  We look forward participating in his postop recovery.     Dionne Ano. Amanda Pea, M.D.     [facility]  D:  07/13/2012  T:  07/14/2012  Job:  [id number]

## 2012-07-14 NOTE — ED Provider Notes (Signed)
Medical screening examination/treatment/procedure(s) were performed by resident physician or non-physician practitioner and as supervising physician I was immediately available for consultation/collaboration.   Barkley Bruns MD.    Linna Hoff, MD 07/14/12 2134

## 2012-07-15 NOTE — ED Notes (Signed)
Abscess culture L finger: Prel. result -abundant Staph. Aureus.  No medicine ordered. Lab shown to Hayden Rasmussen NP and he said pt. was seen here by Dr. Butler Denmark. He did an I and D and he wrote a Rx. for Doxycyline. Pt. was to f/u with Dr. Butler Denmark 9/4. Onalee Hua said to fax the final result to Dr. Butler Denmark. Vassie Moselle 07/15/2012

## 2012-07-16 LAB — CULTURE, ROUTINE-ABSCESS

## 2012-07-21 ENCOUNTER — Telehealth (HOSPITAL_COMMUNITY): Payer: Self-pay | Admitting: *Deleted

## 2012-07-21 NOTE — ED Notes (Addendum)
Abscess culture L finger:  Abundant MRSA.  Pt. adequately treated with Doxycycline.  Prel. lab shown to Dr. Ladon Applebaum.  He said to fax final result to Dr. Butler Denmark.  9/11 Lab faxed to Dr. Candace Cruise office by Aram Beecham and confirmation received.  I called pt. and left a message to call. Vassie Moselle 07/21/2012

## 2012-07-21 NOTE — ED Notes (Signed)
Pt. called back.  Pt. verified x 2 and given results.  Pt. told he was adequately treated with I and D and Doxycycline. Pt. Said he had a sore on his knee 6 mos. ago and was treated with unknown antibiotic by another Urgent care.  Pt. given MRSA instructions.  Pt. voiced understanding. I told him I had faxed the result to Dr. Butler Denmark. Vassie Moselle 07/21/2012

## 2013-01-01 ENCOUNTER — Ambulatory Visit (INDEPENDENT_AMBULATORY_CARE_PROVIDER_SITE_OTHER): Payer: BC Managed Care – PPO | Admitting: Emergency Medicine

## 2013-01-01 VITALS — BP 131/77 | HR 85 | Temp 97.9°F | Resp 16 | Ht 74.0 in | Wt 191.0 lb

## 2013-01-01 DIAGNOSIS — S335XXA Sprain of ligaments of lumbar spine, initial encounter: Secondary | ICD-10-CM

## 2013-01-01 MED ORDER — NAPROXEN SODIUM 550 MG PO TABS: 550.0000 mg | ORAL_TABLET | Freq: Two times a day (BID) | ORAL | Status: AC

## 2013-01-01 MED ORDER — CYCLOBENZAPRINE HCL 10 MG PO TABS: 10.0000 mg | ORAL_TABLET | Freq: Three times a day (TID) | ORAL | Status: DC | PRN

## 2013-01-01 MED ORDER — TRAMADOL HCL 50 MG PO TABS: 50.0000 mg | ORAL_TABLET | Freq: Four times a day (QID) | ORAL | Status: AC | PRN

## 2013-01-01 NOTE — Patient Instructions (Addendum)

## 2013-01-01 NOTE — Progress Notes (Signed)
[facility] [address], [place] [address] [id number] [phone]  Date:  01/01/2013   Name:  [name]   DOB:  09/30/1971   MRN:  [id number]  PCP:  Juline Patch, MD    Chief Complaint: Back Pain   History of Present Illness:  [name] is a 42 y.o. very pleasant male patient who presents with the following:  Low back pain into both buttocks for past two days.  No numbness tingling or weakness.  No history of injury.  Does a lot of lifting at work.  No improvement with OTC medication.  No prior low back injury or pain.   Denies other complaint.  Patient Active Problem List  Diagnosis  . Pneumonia  . Tobacco abuse    Past Medical History  Diagnosis Date  . Bullous emphysema     WITH COMMUNITY ACQUIRED PNEUMONIA  . Nicotine dependence     History reviewed. No pertinent past surgical history.  History  Substance Use Topics  . Smoking status: Current Every Day Smoker -- 1.00 packs/day for 25 years    Types: Cigarettes  . Smokeless tobacco: Never Used     Comment: currently down to 3 cig daily  . Alcohol Use: Yes     Comment: Beer - socially    Family History  Problem Relation Age of Onset  . Prostate cancer Father   . Heart attack Father   . Cancer    . Hypertension Mother   . Diabetes Mother     No Known Allergies  Medication list has been reviewed and updated.  Current Outpatient Prescriptions on File Prior to Visit  Medication Sig Dispense Refill  . acetaminophen (TYLENOL) 500 MG tablet Take 1,000 mg by mouth every 6 (six) hours as needed. Pain      . Cyanocobalamin (VITAMIN B-12) 2500 MCG SUBL Place 1 tablet under the tongue daily.        . diclofenac (VOLTAREN) 75 MG EC tablet Take 1 tablet (75 mg total) by mouth 2 (two) times daily.  30 tablet  0  . Multiple Vitamins-Minerals (MULTIVITAMIN & MINERAL PO) Take 1 tablet by mouth daily.        . Saw Palmetto, Serenoa repens, (SAW PALMETTO PO) Take 1 tablet by mouth 2 (two) times daily.          No current facility-administered medications on file prior to visit.    Review of Systems:  As per HPI, otherwise negative.    Physical Examination: Filed Vitals:   01/01/13 1237  BP: 131/77  Pulse: 85  Temp: 97.9 F (36.6 C)  Resp: 16   Filed Vitals:   01/01/13 1237  Height: 6\' 2"  (1.88 m)  Weight: 191 lb (86.637 kg)   Body mass index is 24.51 kg/(m^2). Ideal Body Weight: Weight in (lb) to have BMI = 25: 194.3   GEN: WDWN, NAD, Non-toxic, Alert & Oriented x 3 HEENT: Atraumatic, Normocephalic.  Ears and Nose: No external deformity. EXTR: No clubbing/cyanosis/edema NEURO: Normal gait.  PSYCH: Normally interactive. Conversant. Not depressed or anxious appearing.  Calm demeanor.  Back:  Tender lumbar para spinous muscles.  No tenderness over spine. Neuro intact  Assessment and Plan: Lumbar strain Anaprox Flexeril Local heat  Carmelina Dane, MD

## 2013-02-02 IMAGING — CR DG CHEST 2V
2 series · 2 of 2 positions shown · non-contrast
Comparison: 07/21/2011

CLINICAL DATA: Pneumonia

CHEST - 2 VIEW

[view not recorded (1 of 2)]
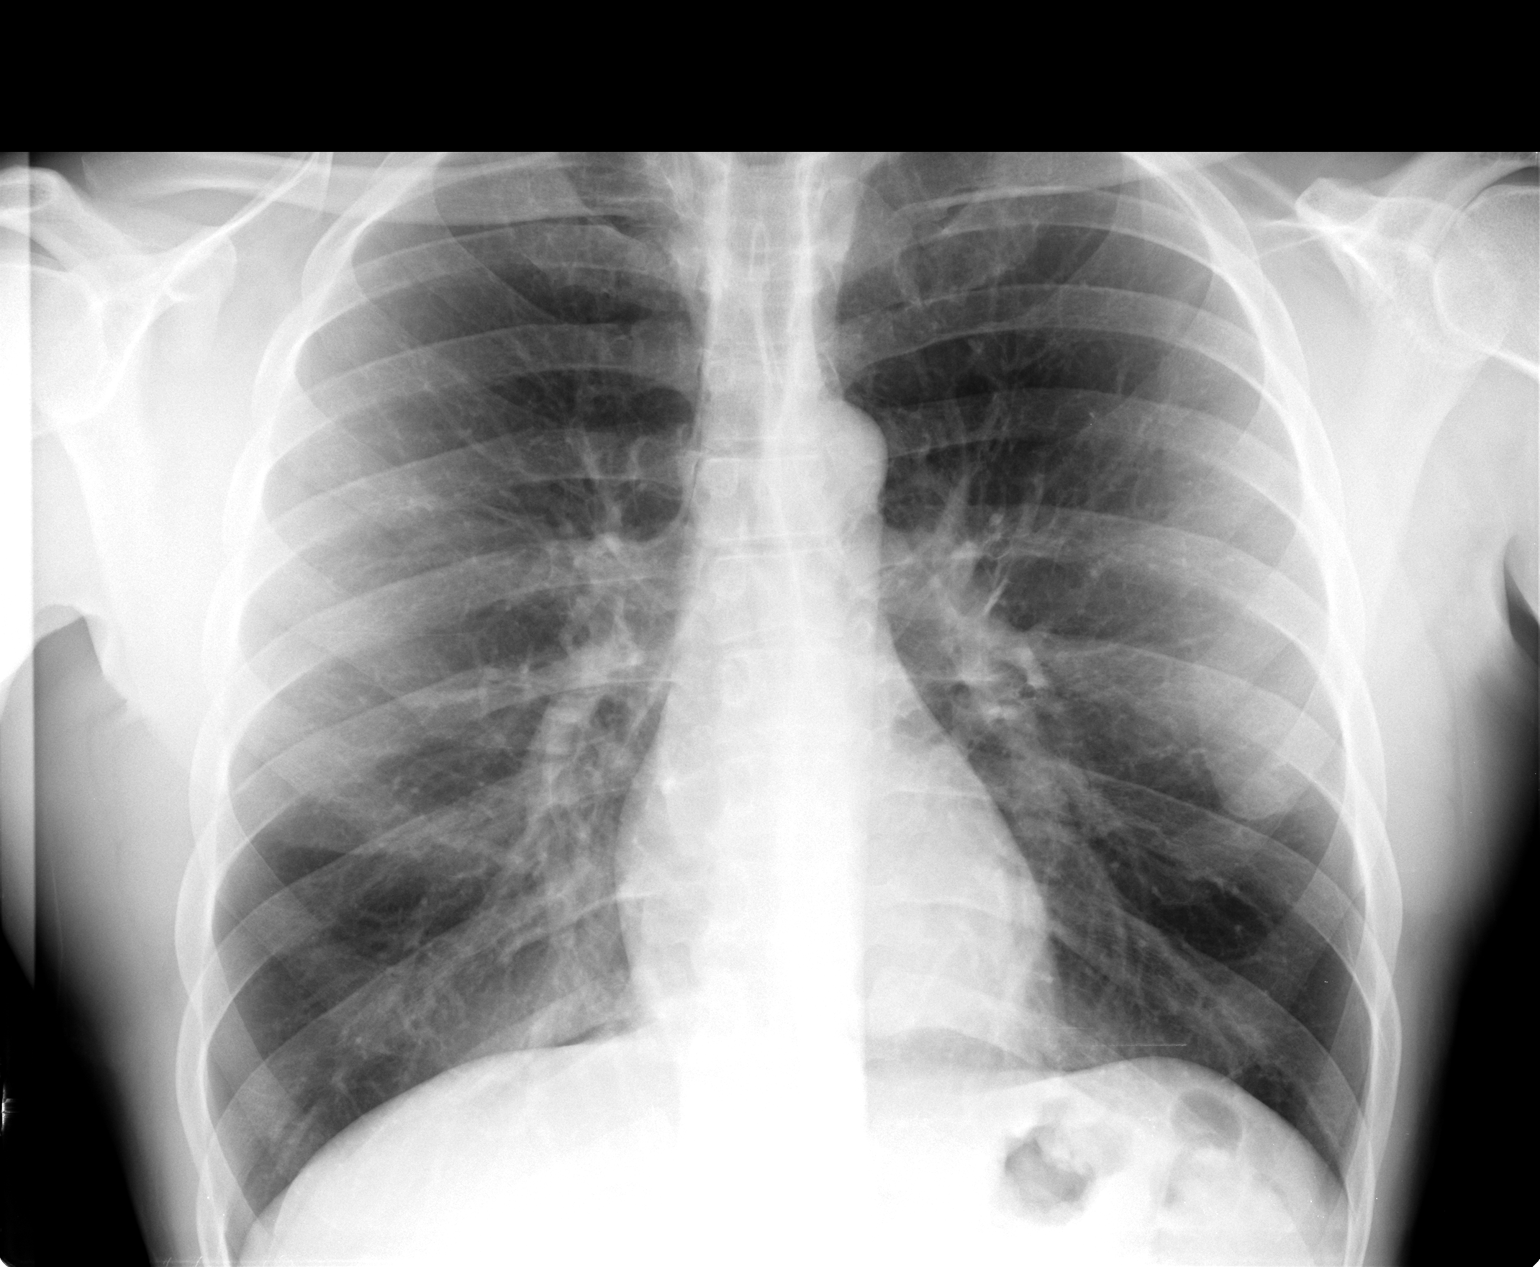

[view not recorded (2 of 2)]
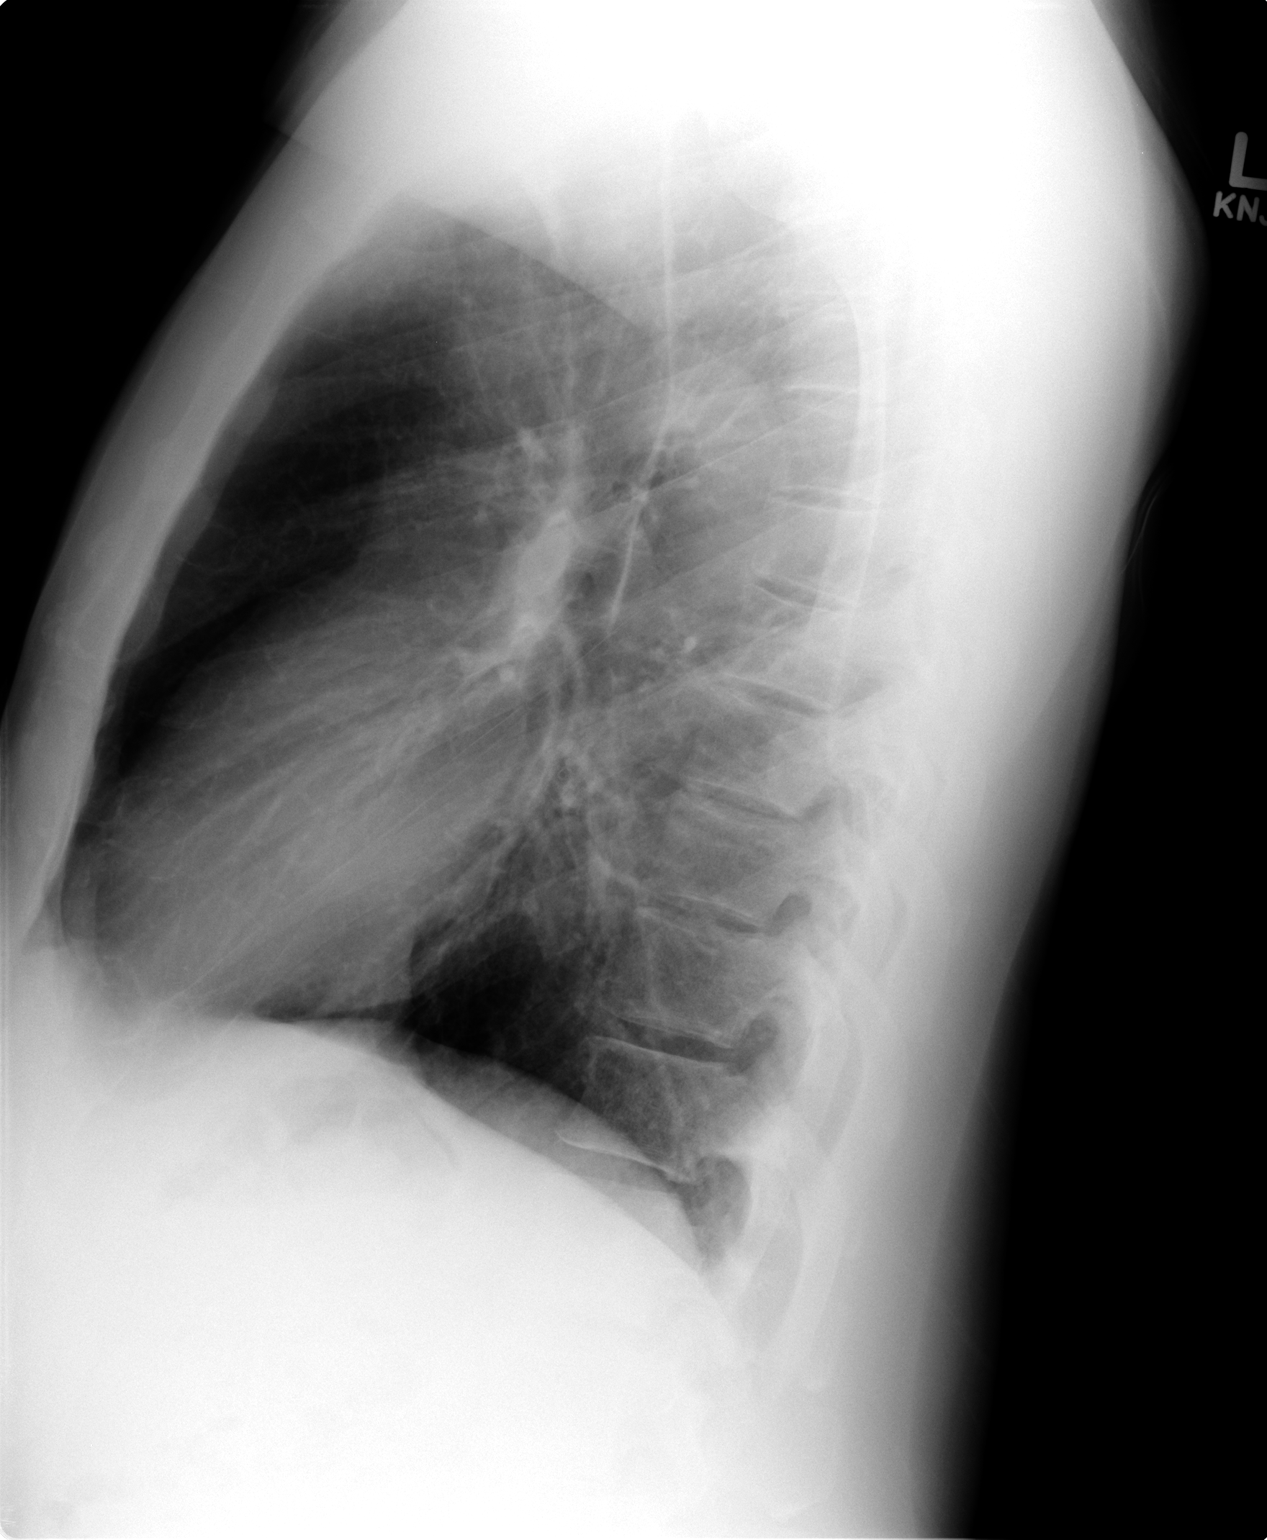

[2 of 2 positions shown; findings below may reference images not displayed]

FINDINGS: Near-complete resolution of the previously noted right
upper lobe airspace disease.  Left lung remains clear.  No
effusion.  Heart size normal.  Regional bones unremarkable.
IMPRESSION: 1.  Interval resolution of right upper lobe airspace disease.

## 2013-06-07 ENCOUNTER — Ambulatory Visit: Payer: Self-pay

## 2013-06-07 ENCOUNTER — Other Ambulatory Visit: Payer: Self-pay | Admitting: Occupational Medicine

## 2013-06-07 DIAGNOSIS — R52 Pain, unspecified: Secondary | ICD-10-CM

## 2013-07-13 ENCOUNTER — Ambulatory Visit: Payer: BC Managed Care – PPO | Attending: Family Medicine | Admitting: Occupational Therapy

## 2013-08-10 ENCOUNTER — Ambulatory Visit: Payer: BC Managed Care – PPO | Admitting: Occupational Therapy

## 2014-03-28 ENCOUNTER — Other Ambulatory Visit: Payer: Self-pay | Admitting: Occupational Medicine

## 2014-03-28 ENCOUNTER — Ambulatory Visit: Payer: Self-pay

## 2014-03-28 DIAGNOSIS — M25512 Pain in left shoulder: Secondary | ICD-10-CM

## 2014-06-01 ENCOUNTER — Other Ambulatory Visit: Payer: Self-pay | Admitting: Internal Medicine

## 2014-06-01 DIAGNOSIS — M25511 Pain in right shoulder: Secondary | ICD-10-CM

## 2014-06-04 ENCOUNTER — Other Ambulatory Visit: Payer: Self-pay

## 2014-06-09 ENCOUNTER — Ambulatory Visit
Admission: RE | Admit: 2014-06-09 | Discharge: 2014-06-09 | Disposition: A | Discharge status: Home / Self Care | Payer: BC Managed Care – PPO | Source: Ambulatory Visit | Attending: Internal Medicine | Admitting: Internal Medicine

## 2014-06-09 DIAGNOSIS — M25511 Pain in right shoulder: Secondary | ICD-10-CM

## 2016-01-27 ENCOUNTER — Emergency Department (HOSPITAL_BASED_OUTPATIENT_CLINIC_OR_DEPARTMENT_OTHER)
Admission: EM | Admit: 2016-01-27 | Discharge: 2016-01-27 | Disposition: A | Discharge status: Home / Self Care | Payer: Self-pay

## 2016-01-27 NOTE — ED Notes (Signed)
3rd call for triage, no answer.

## 2016-01-27 NOTE — ED Notes (Signed)
2nd call for triage, no answer.

## 2016-01-27 NOTE — ED Notes (Signed)
Call x 1 for triage, no answer

## 2016-01-28 ENCOUNTER — Emergency Department (HOSPITAL_BASED_OUTPATIENT_CLINIC_OR_DEPARTMENT_OTHER)
Admission: EM | Admit: 2016-01-28 | Discharge: 2016-01-28 | Disposition: A | Discharge status: Home / Self Care | Payer: BLUE CROSS/BLUE SHIELD | Attending: Emergency Medicine | Admitting: Emergency Medicine

## 2016-01-28 ENCOUNTER — Encounter (HOSPITAL_BASED_OUTPATIENT_CLINIC_OR_DEPARTMENT_OTHER): Payer: Self-pay | Admitting: Emergency Medicine

## 2016-01-28 DIAGNOSIS — F17210 Nicotine dependence, cigarettes, uncomplicated: Secondary | ICD-10-CM | POA: Diagnosis not present

## 2016-01-28 DIAGNOSIS — J3489 Other specified disorders of nose and nasal sinuses: Secondary | ICD-10-CM | POA: Insufficient documentation

## 2016-01-28 DIAGNOSIS — J439 Emphysema, unspecified: Secondary | ICD-10-CM | POA: Insufficient documentation

## 2016-01-28 DIAGNOSIS — H109 Unspecified conjunctivitis: Secondary | ICD-10-CM

## 2016-01-28 DIAGNOSIS — H578 Other specified disorders of eye and adnexa: Secondary | ICD-10-CM | POA: Diagnosis present

## 2016-01-28 MED ORDER — FLUORESCEIN SODIUM 1 MG OP STRP
1.0000 | ORAL_STRIP | Freq: Once | OPHTHALMIC | Status: AC
  Administered 2016-01-28: 1 via OPHTHALMIC
  Filled 2016-01-28: qty 1

## 2016-01-28 MED ORDER — TETRACAINE HCL 0.5 % OP SOLN
2.0000 [drp] | Freq: Once | OPHTHALMIC | Status: AC
  Administered 2016-01-28: 2 [drp] via OPHTHALMIC
  Filled 2016-01-28: qty 4

## 2016-01-28 MED ORDER — POLYMYXIN B-TRIMETHOPRIM 10000-0.1 UNIT/ML-% OP SOLN: 1.0000 [drp] | OPHTHALMIC | Status: DC

## 2016-01-28 MED ORDER — ERYTHROMYCIN 5 MG/GM OP OINT
TOPICAL_OINTMENT | Freq: Four times a day (QID) | OPHTHALMIC | Status: DC
  Administered 2016-01-28: 1 via OPHTHALMIC
  Filled 2016-01-28: qty 3.5

## 2016-01-28 MED ORDER — FLUORESCEIN SODIUM 1 MG OP STRP
ORAL_STRIP | OPHTHALMIC | Status: AC
  Filled 2016-01-28: qty 1

## 2016-01-28 MED ORDER — FLUORESCEIN SODIUM 1 MG OP STRP
1.0000 | ORAL_STRIP | Freq: Once | OPHTHALMIC | Status: AC
  Administered 2016-01-28: 1 via OPHTHALMIC

## 2016-01-28 NOTE — ED Notes (Addendum)
Informed pt to reschedule his eye appointment as an eye exam would not be accurate with conjunctivitis.  Pt related that he would change the appointment to a later time.

## 2016-01-28 NOTE — ED Notes (Signed)
Pt reports increased irriation to left eye, associated with redness and edema  Since Sunday. Today noted some nasal congestion

## 2016-01-28 NOTE — Discharge Instructions (Signed)
Apply one eye drop in both eyes every 4 hours. Use for 7 days. Follow-up with your eye specialist.   Bacterial Conjunctivitis Bacterial conjunctivitis, commonly called pink eye, is an inflammation of the clear membrane that covers the white part of the eye (conjunctiva). The inflammation can also happen on the underside of the eyelids. The blood vessels in the conjunctiva become inflamed, causing the eye to become red or pink. Bacterial conjunctivitis may spread easily from one eye to another and from person to person (contagious).  CAUSES  Bacterial conjunctivitis is caused by bacteria. The bacteria may come from your own skin, your upper respiratory tract, or from someone else with bacterial conjunctivitis. SYMPTOMS  The normally white color of the eye or the underside of the eyelid is usually pink or red. The pink eye is usually associated with irritation, tearing, and some sensitivity to light. Bacterial conjunctivitis is often associated with a thick, yellowish discharge from the eye. The discharge may turn into a crust on the eyelids overnight, which causes your eyelids to stick together. If a discharge is present, there may also be some blurred vision in the affected eye. DIAGNOSIS  Bacterial conjunctivitis is diagnosed by your caregiver through an eye exam and the symptoms that you report. Your caregiver looks for changes in the surface tissues of your eyes, which may point to the specific type of conjunctivitis. A sample of any discharge may be collected on a cotton-tip swab if you have a severe case of conjunctivitis, if your cornea is affected, or if you keep getting repeat infections that do not respond to treatment. The sample will be sent to a lab to see if the inflammation is caused by a bacterial infection and to see if the infection will respond to antibiotic medicines. TREATMENT   Bacterial conjunctivitis is treated with antibiotics. Antibiotic eyedrops are most often used. However,  antibiotic ointments are also available. Antibiotics pills are sometimes used. Artificial tears or eye washes may ease discomfort. HOME CARE INSTRUCTIONS   To ease discomfort, apply a cool, clean washcloth to your eye for 10-20 minutes, 3-4 times a day.  Gently wipe away any drainage from your eye with a warm, wet washcloth or a cotton ball.  Wash your hands often with soap and water. Use paper towels to dry your hands.  Do not share towels or washcloths. This may spread the infection.  Change or wash your pillowcase every day.  You should not use eye makeup until the infection is gone.  Do not operate machinery or drive if your vision is blurred.  Stop using contact lenses. Ask your caregiver how to sterilize or replace your contacts before using them again. This depends on the type of contact lenses that you use.  When applying medicine to the infected eye, do not touch the edge of your eyelid with the eyedrop bottle or ointment tube. SEEK IMMEDIATE MEDICAL CARE IF:   Your infection has not improved within 3 days after beginning treatment.  You had yellow discharge from your eye and it returns.  You have increased eye pain.  Your eye redness is spreading.  Your vision becomes blurred.  You have a fever or persistent symptoms for more than 2-3 days.  You have a fever and your symptoms suddenly get worse.  You have facial pain, redness, or swelling. MAKE SURE YOU:   Understand these instructions.  Will watch your condition.  Will get help right away if you are not doing well or get worse.  This information is not intended to replace advice given to you by your health care provider. Make sure you discuss any questions you have with your health care provider.   Document Released: 10/27/2005 Document Revised: 11/17/2014 Document Reviewed: 03/29/2012 Elsevier Interactive Patient Education [employer]2016 [employer]vier Inc.

## 2016-01-28 NOTE — ED Provider Notes (Signed)
CSN: [id number]     Arrival date & time 01/28/16  1004 History   First MD Initiated Contact with Patient 01/28/16 1107     Chief Complaint  Patient presents with  . Eye Drainage     (Consider location/radiation/quality/duration/timing/severity/associated sxs/prior Treatment) HPI [name] is a 45 y.o. male with history of emphysema, presents to emergency department complaining of bilateral eye redness and itching. Patient states symptoms started in the left eye yesterday morning. He reports sensation of sand in his eye. He states that he noticed that his right was red. He tried some Visine and taking Benadryl last night. He states he has had some relief with these medications but he continues to have redness and itching. He also noticed that his eye is now very sensitive to the light. He denies blurred vision. He states he is having purulent drainage out of left eye. This morning he started having some itching in the right eye as well. Patient has an appointment with ophthalmologist today, but stated that "I just don't think I can go to the eye doctor like this."  Past Medical History  Diagnosis Date  . Bullous emphysema (HCC)     WITH COMMUNITY ACQUIRED PNEUMONIA  . Nicotine dependence    History reviewed. No pertinent past surgical history. Family History  Problem Relation Age of Onset  . Prostate cancer Father   . Heart attack Father   . Cancer    . Hypertension Mother   . Diabetes Mother    Social History  Substance Use Topics  . Smoking status: Current Every Day Smoker -- 1.00 packs/day for 25 years    Types: Cigarettes  . Smokeless tobacco: Never Used     Comment: currently down to 3 cig daily  . Alcohol Use: Yes     Comment: Beer - socially    Review of Systems  Constitutional: Negative for fever and chills.  HENT: Positive for congestion.   Eyes: Positive for photophobia, pain, discharge, redness and itching. Negative for visual disturbance.  Neurological: Negative  for headaches.      Allergies  Review of patient's allergies indicates no known allergies.  Home Medications   Prior to Admission medications   Medication Sig Start Date End Date Taking? Authorizing Provider  diphenhydrAMINE (BENADRYL) 25 MG tablet Take 25 mg by mouth every 6 (six) hours as needed.   Yes Historical Provider, MD  acetaminophen (TYLENOL) 500 MG tablet Take 1,000 mg by mouth every 6 (six) hours as needed. Pain    Historical Provider, MD   BP 136/95 mmHg  Pulse 84  Temp(Src) 98.2 F (36.8 C) (Oral)  Resp 16  Ht  (1.88 m)  Wt 99.791 kg  BMI 28.23 kg/m2  SpO2 100% Physical Exam  Constitutional: He is oriented to person, place, and time. He appears well-developed and well-nourished. No distress.  HENT:  Head: Normocephalic and atraumatic.  Right Ear: External ear normal.  Left Ear: External ear normal.  Mouth/Throat: Oropharynx is clear and moist.  Clear rhinorrhea, pharynx erythemous, uvula midline  Eyes: Conjunctivae are normal.  Right eyes normal. Left eye, normal lids, conjunctivae is injected, small amount of purulent discharge noted. Negative fluorescein stain bilaterally. No corneal abrasions or fluorescein uptake noted. No hyphema or hypopyon.  Neck: Normal range of motion. Neck supple.  Cardiovascular: Normal rate, regular rhythm and normal heart sounds.   Pulmonary/Chest: Effort normal and breath sounds normal. No respiratory distress. He has no wheezes. He has no rales.  Abdominal:  Soft. Bowel sounds are normal. There is no tenderness.  Musculoskeletal: He exhibits no edema or tenderness.  Lymphadenopathy:    He has no cervical adenopathy.  Neurological: He is alert and oriented to person, place, and time.  Skin: Skin is warm and dry. No erythema.  Psychiatric: He has a normal mood and affect.  Nursing note and vitals reviewed.   ED Course  Procedures (including critical care time) Labs Review Labs Reviewed - No data to display  Imaging  Review No results found. I have personally reviewed and evaluated these images and lab results as part of my medical decision-making.   EKG Interpretation None      MDM   Final diagnoses:  Bilateral conjunctivitis    Patient with bilateral eye irritation, left worse than right. He does not wear contacts. Visual acuity is 20/40 bilaterally. Exam is unremarkable, including no corneal abrasions on fluorescein stain. Most consistent with conjunctivitis.  Will treat with Polytrim drops. Follow-up with ophthalmologist.  Ceasar MonsFiled Vitals:   01/28/16 1027  BP: 136/95  Pulse: 84  Temp: 98.2 F (36.8 C)  TempSrc: Oral  Resp: 16  Height: 6\' 2"  (1.88 m)  Weight: 99.791 kg  SpO2: 100%       Jaynie Crumbleatyana Deziyah Arvin, PA-C 01/28/16 1652  Loren Raceravid Yelverton, MD 01/30/16 [phone]

## 2016-06-30 ENCOUNTER — Ambulatory Visit (INDEPENDENT_AMBULATORY_CARE_PROVIDER_SITE_OTHER): Payer: BLUE CROSS/BLUE SHIELD

## 2016-06-30 ENCOUNTER — Ambulatory Visit (INDEPENDENT_AMBULATORY_CARE_PROVIDER_SITE_OTHER): Payer: BLUE CROSS/BLUE SHIELD | Admitting: Physician Assistant

## 2016-06-30 ENCOUNTER — Encounter: Payer: Self-pay | Admitting: Physician Assistant

## 2016-06-30 VITALS — BP 126/82 | HR 82 | Temp 98.1°F | Resp 18 | Ht 74.0 in | Wt 216.0 lb

## 2016-06-30 DIAGNOSIS — S134XXA Sprain of ligaments of cervical spine, initial encounter: Secondary | ICD-10-CM

## 2016-06-30 DIAGNOSIS — M25512 Pain in left shoulder: Secondary | ICD-10-CM

## 2016-06-30 DIAGNOSIS — G8911 Acute pain due to trauma: Secondary | ICD-10-CM | POA: Diagnosis not present

## 2016-06-30 DIAGNOSIS — M542 Cervicalgia: Secondary | ICD-10-CM | POA: Diagnosis not present

## 2016-06-30 MED ORDER — CYCLOBENZAPRINE HCL 5 MG PO TABS: 5.0000 mg | ORAL_TABLET | Freq: Three times a day (TID) | ORAL | 1 refills | Status: AC | PRN

## 2016-06-30 MED ORDER — MELOXICAM 7.5 MG PO TABS: 7.5000 mg | ORAL_TABLET | Freq: Every day | ORAL | 0 refills | Status: AC
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
## 2018-10-07 ENCOUNTER — Encounter (HOSPITAL_COMMUNITY): Payer: Self-pay | Admitting: Emergency Medicine

## 2018-10-07 DIAGNOSIS — F17210 Nicotine dependence, cigarettes, uncomplicated: Secondary | ICD-10-CM

## 2018-10-07 DIAGNOSIS — Z79899 Other long term (current) drug therapy: Secondary | ICD-10-CM

## 2018-10-07 DIAGNOSIS — K5900 Constipation, unspecified: Secondary | ICD-10-CM | POA: Insufficient documentation
